# Patient Record
Sex: Female | Born: 2000 | Race: White | Hispanic: No | Marital: Single | Smoking: Never smoker
Health system: Southern US, Community
[De-identification: ages and names within clinical notes are randomized; demographics above are authoritative.]

## PROBLEM LIST (undated history)

## (undated) DIAGNOSIS — K219 Gastro-esophageal reflux disease without esophagitis: Secondary | ICD-10-CM

---
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
## 2021-02-16 DIAGNOSIS — Z20822 Contact with and (suspected) exposure to covid-19: Secondary | ICD-10-CM | POA: Diagnosis not present

## 2021-02-16 DIAGNOSIS — R6883 Chills (without fever): Secondary | ICD-10-CM | POA: Diagnosis not present

## 2021-02-16 DIAGNOSIS — H65192 Other acute nonsuppurative otitis media, left ear: Secondary | ICD-10-CM | POA: Diagnosis not present

## 2021-02-16 DIAGNOSIS — R519 Headache, unspecified: Secondary | ICD-10-CM | POA: Diagnosis not present

## 2021-02-16 DIAGNOSIS — R5383 Other fatigue: Secondary | ICD-10-CM | POA: Diagnosis not present

## 2021-02-16 DIAGNOSIS — J351 Hypertrophy of tonsils: Secondary | ICD-10-CM | POA: Diagnosis not present

## 2022-02-20 DIAGNOSIS — S0303XA Dislocation of jaw, bilateral, initial encounter: Secondary | ICD-10-CM | POA: Diagnosis not present

## 2022-02-20 DIAGNOSIS — G501 Atypical facial pain: Secondary | ICD-10-CM | POA: Diagnosis not present

## 2022-02-20 DIAGNOSIS — X58XXXA Exposure to other specified factors, initial encounter: Secondary | ICD-10-CM | POA: Diagnosis not present

## 2022-12-12 DIAGNOSIS — Z113 Encounter for screening for infections with a predominantly sexual mode of transmission: Secondary | ICD-10-CM | POA: Diagnosis not present

## 2022-12-12 DIAGNOSIS — Z1151 Encounter for screening for human papillomavirus (HPV): Secondary | ICD-10-CM | POA: Diagnosis not present

## 2022-12-13 DIAGNOSIS — N926 Irregular menstruation, unspecified: Secondary | ICD-10-CM | POA: Diagnosis not present

## 2023-03-05 DIAGNOSIS — Z111 Encounter for screening for respiratory tuberculosis: Secondary | ICD-10-CM | POA: Diagnosis not present

## 2023-03-19 DIAGNOSIS — Z111 Encounter for screening for respiratory tuberculosis: Secondary | ICD-10-CM | POA: Diagnosis not present
